# Patient Record
Sex: Female | Born: 1975 | Race: White | Hispanic: No | Marital: Single | Smoking: Former smoker
Health system: Southern US, Community
[De-identification: ages and names within clinical notes are randomized; demographics above are authoritative.]

## PROBLEM LIST (undated history)

## (undated) HISTORY — PX: ABDOMINAL HYSTERECTOMY: SHX81

## (undated) HISTORY — PX: TUBAL LIGATION: SHX77

## (undated) HISTORY — PX: CYST REMOVAL TRUNK: SHX6283

---

## 1999-04-15 ENCOUNTER — Other Ambulatory Visit: Admission: RE | Admit: 1999-04-15 | Discharge: 1999-04-15 | Payer: Self-pay | Admitting: Obstetrics & Gynecology

## 2001-08-13 ENCOUNTER — Inpatient Hospital Stay (HOSPITAL_COMMUNITY): Admission: AD | Admit: 2001-08-13 | Discharge: 2001-08-13 | Payer: Self-pay | Admitting: *Deleted

## 2018-07-24 ENCOUNTER — Encounter (HOSPITAL_COMMUNITY): Payer: Self-pay | Admitting: *Deleted

## 2018-07-24 ENCOUNTER — Ambulatory Visit (HOSPITAL_COMMUNITY)
Admission: EM | Admit: 2018-07-24 | Discharge: 2018-07-24 | Disposition: A | Discharge status: Home / Self Care | Attending: Family | Admitting: Family

## 2018-07-24 ENCOUNTER — Ambulatory Visit (INDEPENDENT_AMBULATORY_CARE_PROVIDER_SITE_OTHER)

## 2018-07-24 DIAGNOSIS — W010XXA Fall on same level from slipping, tripping and stumbling without subsequent striking against object, initial encounter: Secondary | ICD-10-CM

## 2018-07-24 DIAGNOSIS — M25571 Pain in right ankle and joints of right foot: Secondary | ICD-10-CM | POA: Diagnosis not present

## 2018-07-24 DIAGNOSIS — W19XXXA Unspecified fall, initial encounter: Secondary | ICD-10-CM

## 2018-07-24 DIAGNOSIS — Y92009 Unspecified place in unspecified non-institutional (private) residence as the place of occurrence of the external cause: Secondary | ICD-10-CM

## 2018-07-24 DIAGNOSIS — M79604 Pain in right leg: Secondary | ICD-10-CM | POA: Diagnosis not present

## 2018-07-24 DIAGNOSIS — M25551 Pain in right hip: Secondary | ICD-10-CM | POA: Diagnosis not present

## 2018-07-24 MED ORDER — DICLOFENAC SODIUM 75 MG PO TBEC: 75.0000 mg | DELAYED_RELEASE_TABLET | Freq: Two times a day (BID) | ORAL | 0 refills | Status: DC | PRN

## 2018-07-24 MED ORDER — CYCLOBENZAPRINE HCL 10 MG PO TABS: ORAL_TABLET | ORAL | 0 refills | Status: AC

## 2018-07-24 NOTE — Discharge Instructions (Addendum)
Recommend start Voltaren 75mg  twice a day as directed. May take Flexeril 10mg  1/2 to 1 tablet every 8 hours as needed for muscle pain/spasms. Apply heat to hip and leg area. Apply ice to ankle. Wear ace wrap for support. Follow-up with the Orthopedic as planned for further evaluation.
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
## 2018-07-24 NOTE — ED Provider Notes (Signed)
[facility]    CSN: [id number] Arrival date & time: 07/24/18  1223     History   Chief Complaint Chief Complaint  Patient presents with  . Fall  . Leg Pain    HPI Alexis Fuller is a 42 y.o. female.   42 year old female presents with injury to her right hip, leg and ankle from a fall about 1 week ago. She was trying to get to the bathroom when she slipped, twisted her ankle and fell on her right side. She was intoxicated from alcohol consumption so she does not remember incident. But a friend told her she did not hit her head or injure other parts of her body. She experienced immediate pain (per friend) and continues to have right ankle pain that travels up to her knee as well as pain that travels down from her right lower hip. She denies any numbness. She is unable to bear full weight on her right side. She has applied ice and taken Naproxen with minimal relief. No previous injury to right hip or ankle to her knowledge. No other chronic health issues. Takes no daily medication.   The history is provided by the patient.    History reviewed. No pertinent past medical history.  There are no active problems to display for this patient.   Past Surgical History:  Procedure Laterality Date  . CYST REMOVAL TRUNK    . TUBAL LIGATION      OB History   No obstetric history on file.      Home Medications    Prior to Admission medications   Medication Sig Start Date End Date Taking? Authorizing Provider  cyclobenzaprine (FLEXERIL) 10 MG tablet Take 1/2 to 1 whole tablet every 8 hours as needed for muscle pain/spasms. 07/24/18   Katy Apo, NP  diclofenac (VOLTAREN) 75 MG EC tablet Take 1 tablet (75 mg total) by mouth 2 (two) times daily as needed for moderate pain. 07/24/18   Katy Apo, NP    Family History Family History  Problem Relation Age of Onset  . Diabetes Father   . Seizures Son   . Autism spectrum disorder Son     Social History Social  History   Tobacco Use  . Smoking status: Current Every Day Smoker  . Smokeless tobacco: Never Used  Substance Use Topics  . Alcohol use: Yes    Comment: occasionally  . Drug use: Never     Allergies   Percocet [oxycodone-acetaminophen] and Septra [sulfamethoxazole-trimethoprim]   Review of Systems Review of Systems  Constitutional: Negative for activity change, appetite change, chills, fatigue and fever.  Eyes: Negative for photophobia and visual disturbance.  Respiratory: Negative for cough, chest tightness, shortness of breath and wheezing.   Cardiovascular: Negative for chest pain and leg swelling.  Gastrointestinal: Negative for nausea and vomiting.  Genitourinary: Negative for decreased urine volume, difficulty urinating, flank pain, hematuria and pelvic pain.  Musculoskeletal: Positive for arthralgias, gait problem and myalgias. Negative for back pain.  Skin: Negative for color change, rash and wound.  Neurological: Negative for dizziness, tremors, seizures, syncope, weakness, light-headedness, numbness and headaches.  Hematological: Negative for adenopathy. Does not bruise/bleed easily.     Physical Exam Triage Vital Signs ED Triage Vitals [07/24/18 1328]  Enc Vitals Group     BP 101/66     Pulse Rate 84     Resp 18     Temp (!) 97.4 F (36.3 C)     Temp Source  Oral     SpO2 97 %     Weight      Height      Head Circumference      Peak Flow      Pain Score 9     Pain Loc      Pain Edu?      Excl. in [place]?    No data found.  Updated Vital Signs BP 101/66   Pulse 84   Temp (!) 97.4 F (36.3 C) (Oral)   Resp 18   LMP 07/03/2018 (Exact Date)   SpO2 97%   Visual Acuity Right Eye Distance:   Left Eye Distance:   Bilateral Distance:    Right Eye Near:   Left Eye Near:    Bilateral Near:     Physical Exam Vitals signs and nursing note reviewed.  Constitutional:      General: She is not in acute distress.    Appearance: Normal appearance. She is  well-developed and overweight. She is not ill-appearing.  HENT:     Head: Normocephalic and atraumatic.     Right Ear: External ear normal.     Left Ear: External ear normal.     Nose: Nose normal.  Eyes:     Extraocular Movements: Extraocular movements intact.     Conjunctiva/sclera: Conjunctivae normal.  Neck:     Musculoskeletal: Normal range of motion.  Cardiovascular:     Rate and Rhythm: Normal rate.  Pulmonary:     Effort: Pulmonary effort is normal.  Musculoskeletal:        General: Tenderness and signs of injury present. No swelling.     Right hip: She exhibits decreased range of motion and tenderness. She exhibits no swelling, no deformity and no laceration.     Right lower leg: No edema.     Left lower leg: No edema.       Legs:     Right foot: Decreased range of motion. Normal capillary refill. Tenderness and swelling present. No deformity.       Feet:     Comments: Has decreased range of motion of right hip, particularly with flexion and full extension. Tender mainly in right lower hip and upper hamstring area. No distinct swelling or bruising present.  Has decreased range of motion or right ankle, particularly with rotation. Pain with all movements. Tender along lateral malleolus area with mild swelling. No distinct bruising present. Good distal pulses and capillary refill. No neuro deficits noted.    Skin:    General: Skin is warm and dry.     Capillary Refill: Capillary refill takes less than 2 seconds.     Findings: No bruising, erythema or rash.  Neurological:     General: No focal deficit present.     Mental Status: She is alert and oriented to person, place, and time.     Sensory: Sensation is intact.     Motor: Motor function is intact.     Gait: Gait abnormal (due to right hip and foot pain).  Psychiatric:        Attention and Perception: Attention normal.        Mood and Affect: Mood and affect normal.        Speech: Speech normal.        Behavior:  Behavior is cooperative.      UC Treatments / Results  Labs (all labs ordered are listed, but only abnormal results are displayed) Labs Reviewed - No data to display  EKG None  Radiology Dg Ankle Complete Right  Result Date: 07/24/2018 CLINICAL DATA:  Post fall 1 week ago with persistent right ankle pain. EXAM: RIGHT ANKLE - COMPLETE 3+ VIEW COMPARISON:  None. FINDINGS: No definite acute fracture or dislocation. Joint spaces are preserved. Ankle mortise is preserved. Suspected small ankle joint effusion. Tiny ossicle adjacent to the cranial aspect of the navicular bone likely represents sequela of remote avulsive injury. Small plantar calcaneal spur. Minimal enthesopathic change involving the Achilles tendon insertion site. Suspected minimal soft tissue swelling about the lateral malleolus. IMPRESSION: 1. Small amount of soft tissue swelling about the lateral malleolus with associated small ankle joint effusion but without associated acute fracture or dislocation. 2. Suspected old avulsive injury of the navicular bone. 3. Small plantar calcaneal spur. Electronically Signed   By: Sandi Mariscal M.D.   On: 07/24/2018 14:42   Dg Hip Unilat With Pelvis 2-3 Views Right  Result Date: 07/24/2018 CLINICAL DATA:  Post fall with persistent right hip pain. EXAM: DG HIP (WITH OR WITHOUT PELVIS) 2-3V RIGHT COMPARISON:  None. FINDINGS: No fracture or dislocation. Right hip joint spaces appear preserved. No definite evidence of avascular necrosis. Limited visualization of the pelvis and contralateral left hip is normal. Ingested radiopaque debris/pill fragment overlies the left lower abdomen. Regional soft tissues appear normal. IMPRESSION: No explanation for patient's persistent right hip pain. Electronically Signed   By: Sandi Mariscal M.D.   On: 07/24/2018 14:43    Procedures Procedures (including critical care time)  Medications Ordered in UC Medications - No data to display  Initial Impression /  Assessment and Plan / UC Course  I have reviewed the triage vital signs and the nursing notes.  Pertinent labs & imaging results that were available during my care of the patient were reviewed by me and considered in my medical decision making (see chart for details).    Reviewed hip x-ray results- no evidence of fracture or dislocation. Reviewed right ankle x-ray results- no fracture but soft tissue swelling with ankle effusion and heel spur. Discussed possible need for further evaluation with Orthopedic. Recommend trial Voltaren 75mg  twice a day as directed. May take Flexeril 10mg  1/2 to 1 whole tablet every 8 hours as needed for muscle pain/spasms. Apply heat to hip area for comfort and ice ankle. Wear ace wrap for support. Follow-up with [facility] as planned for further evaluation.  Final Clinical Impressions(s) / UC Diagnoses   Final diagnoses:  Fall at home, initial encounter  Acute right hip pain  Right leg pain  Acute right ankle pain     Discharge Instructions     Recommend start Voltaren 75mg  twice a day as directed. May take Flexeril 10mg  1/2 to 1 tablet every 8 hours as needed for muscle pain/spasms. Apply heat to hip and leg area. Apply ice to ankle. Wear ace wrap for support. Follow-up with the Orthopedic as planned for further evaluation.     ED Prescriptions    Medication Sig Dispense Auth. Provider   diclofenac (VOLTAREN) 75 MG EC tablet Take 1 tablet (75 mg total) by mouth 2 (two) times daily as needed for moderate pain. 20 tablet Katy Apo, NP   cyclobenzaprine (FLEXERIL) 10 MG tablet Take 1/2 to 1 whole tablet every 8 hours as needed for muscle pain/spasms. 15 tablet Katy Apo, NP     Controlled Substance Prescriptions [place] Controlled Substance Registry consulted? Yes, I have consulted the [place] Controlled Substances Registry for this patient. No  active Rx listed. No need for controlled medication for pain management at this time.      Katy Apo, NP 07/25/18 [phone]

## 2018-12-13 ENCOUNTER — Other Ambulatory Visit: Payer: Self-pay

## 2018-12-13 ENCOUNTER — Encounter (HOSPITAL_COMMUNITY): Payer: Self-pay | Admitting: Emergency Medicine

## 2018-12-13 ENCOUNTER — Emergency Department (HOSPITAL_COMMUNITY)

## 2018-12-13 ENCOUNTER — Emergency Department (HOSPITAL_COMMUNITY)
Admission: EM | Admit: 2018-12-13 | Discharge: 2018-12-13 | Disposition: A | Discharge status: Home / Self Care | Attending: Emergency Medicine | Admitting: Emergency Medicine

## 2018-12-13 DIAGNOSIS — M79603 Pain in arm, unspecified: Secondary | ICD-10-CM | POA: Diagnosis present

## 2018-12-13 DIAGNOSIS — M50021 Cervical disc disorder at C4-C5 level with myelopathy: Secondary | ICD-10-CM | POA: Diagnosis not present

## 2018-12-13 DIAGNOSIS — M5010 Cervical disc disorder with radiculopathy, unspecified cervical region: Secondary | ICD-10-CM

## 2018-12-13 DIAGNOSIS — F17200 Nicotine dependence, unspecified, uncomplicated: Secondary | ICD-10-CM | POA: Insufficient documentation

## 2018-12-13 DIAGNOSIS — R200 Anesthesia of skin: Secondary | ICD-10-CM | POA: Insufficient documentation

## 2018-12-13 DIAGNOSIS — Z79899 Other long term (current) drug therapy: Secondary | ICD-10-CM | POA: Insufficient documentation

## 2018-12-13 MED ORDER — PREDNISONE 50 MG PO TABS
50.0000 mg | ORAL_TABLET | Freq: Once | ORAL | Status: AC
  Administered 2018-12-13: 50 mg via ORAL
  Filled 2018-12-13: qty 1

## 2018-12-13 MED ORDER — IBUPROFEN 800 MG PO TABS
800.0000 mg | ORAL_TABLET | Freq: Once | ORAL | Status: AC
  Administered 2018-12-13: 800 mg via ORAL
  Filled 2018-12-13: qty 1

## 2018-12-13 MED ORDER — METHYLPREDNISOLONE 4 MG PO TBPK: ORAL_TABLET | ORAL | 0 refills | Status: DC

## 2018-12-13 NOTE — ED Notes (Signed)
Patient transported to MRI by this RN  

## 2018-12-13 NOTE — ED Notes (Signed)
Pt ambulatory to bathroom without difficulty.  

## 2018-12-13 NOTE — ED Triage Notes (Signed)
Pt reports that she was moving into a new place and yesterday when she was getting on to the bed she did something to her neck and then started having bilat arm tingling and numbness. Reports went to [facility] yesterday but sat there for 4 hours without being seen and decided to leave. Adds that her hands are swollen.

## 2018-12-13 NOTE — ED Notes (Signed)
Pt d/c home per MD order. Discharge summary reviewed, pt verbalizes understanding. Ambulatory off unit.  

## 2018-12-13 NOTE — ED Notes (Signed)
Bed: WA09 Expected date:  Expected time:  Means of arrival:  Comments: 25
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
## 2019-06-12 ENCOUNTER — Other Ambulatory Visit: Payer: Self-pay | Admitting: Radiology

## 2019-06-12 HISTORY — PX: BREAST BIOPSY: SHX20

## 2021-01-22 ENCOUNTER — Emergency Department (HOSPITAL_COMMUNITY): Payer: No Typology Code available for payment source

## 2021-01-22 ENCOUNTER — Other Ambulatory Visit: Payer: Self-pay

## 2021-01-22 ENCOUNTER — Encounter (HOSPITAL_COMMUNITY): Payer: Self-pay | Admitting: Emergency Medicine

## 2021-01-22 ENCOUNTER — Emergency Department (HOSPITAL_COMMUNITY)
Admission: EM | Admit: 2021-01-22 | Discharge: 2021-01-22 | Disposition: A | Discharge status: Home / Self Care | Payer: No Typology Code available for payment source | Attending: Emergency Medicine | Admitting: Emergency Medicine

## 2021-01-22 DIAGNOSIS — F17200 Nicotine dependence, unspecified, uncomplicated: Secondary | ICD-10-CM | POA: Diagnosis not present

## 2021-01-22 DIAGNOSIS — M542 Cervicalgia: Secondary | ICD-10-CM | POA: Insufficient documentation

## 2021-01-22 DIAGNOSIS — R519 Headache, unspecified: Secondary | ICD-10-CM

## 2021-01-22 DIAGNOSIS — Y92410 Unspecified street and highway as the place of occurrence of the external cause: Secondary | ICD-10-CM | POA: Insufficient documentation

## 2021-01-22 DIAGNOSIS — S161XXA Strain of muscle, fascia and tendon at neck level, initial encounter: Secondary | ICD-10-CM

## 2021-01-22 MED ORDER — ACETAMINOPHEN 500 MG PO TABS
1000.0000 mg | ORAL_TABLET | Freq: Once | ORAL | Status: AC
  Administered 2021-01-22: 1000 mg via ORAL
  Filled 2021-01-22: qty 2

## 2021-01-22 MED ORDER — METHOCARBAMOL 750 MG PO TABS: 750.0000 mg | ORAL_TABLET | Freq: Three times a day (TID) | ORAL | 0 refills | Status: AC | PRN

## 2021-01-22 MED ORDER — METHOCARBAMOL 750 MG PO TABS: 750.0000 mg | ORAL_TABLET | Freq: Three times a day (TID) | ORAL | 0 refills | Status: DC | PRN

## 2021-01-22 NOTE — Discharge Instructions (Addendum)
It was our pleasure to provide your ER care today - we hope that you feel better.  Your cts look good - no acute traumatic injury is noted.   Take ibuprofen or acetaminophen as need for pain.  You may also take robaxin as need for muscle pain/spasm - no driving when taking.  Follow up with primary care doctor in 1-2 weeks if symptoms fail to improve/resolve.  Return to ER if worse, new symptoms, new/severe pain, numbness/weakness, or other concern.
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
## 2021-03-14 ENCOUNTER — Other Ambulatory Visit: Payer: Self-pay | Admitting: *Deleted

## 2021-03-14 ENCOUNTER — Telehealth: Payer: Self-pay | Admitting: *Deleted

## 2021-03-14 DIAGNOSIS — Z1231 Encounter for screening mammogram for malignant neoplasm of breast: Secondary | ICD-10-CM

## 2021-03-14 NOTE — Telephone Encounter (Signed)
Attempted to call patient to schedule appointment for screening mammogram. No one answered the phone. Left voicemail for patient to cal me back.

## 2021-04-07 ENCOUNTER — Other Ambulatory Visit: Payer: Self-pay | Admitting: Student

## 2021-04-07 ENCOUNTER — Other Ambulatory Visit: Payer: Self-pay

## 2021-04-07 ENCOUNTER — Ambulatory Visit
Admission: RE | Admit: 2021-04-07 | Discharge: 2021-04-07 | Disposition: A | Discharge status: Home / Self Care | Payer: No Typology Code available for payment source | Source: Ambulatory Visit | Attending: Student | Admitting: Student

## 2021-04-07 DIAGNOSIS — Z1231 Encounter for screening mammogram for malignant neoplasm of breast: Secondary | ICD-10-CM

## 2022-11-07 ENCOUNTER — Ambulatory Visit
Admission: EM | Admit: 2022-11-07 | Discharge: 2022-11-07 | Disposition: A | Discharge status: Home / Self Care | Payer: Medicaid Other | Attending: Physician Assistant | Admitting: Physician Assistant

## 2022-11-07 ENCOUNTER — Encounter: Payer: Self-pay | Admitting: Emergency Medicine

## 2022-11-07 DIAGNOSIS — N760 Acute vaginitis: Secondary | ICD-10-CM | POA: Diagnosis present

## 2022-11-07 DIAGNOSIS — Z113 Encounter for screening for infections with a predominantly sexual mode of transmission: Secondary | ICD-10-CM | POA: Insufficient documentation

## 2022-11-07 DIAGNOSIS — R300 Dysuria: Secondary | ICD-10-CM | POA: Insufficient documentation

## 2022-11-07 LAB — URINALYSIS, W/ REFLEX TO CULTURE (INFECTION SUSPECTED)
Bilirubin Urine: NEGATIVE
Glucose, UA: NEGATIVE mg/dL
Hgb urine dipstick: NEGATIVE
Ketones, ur: NEGATIVE mg/dL
Leukocytes,Ua: NEGATIVE
Nitrite: NEGATIVE
Protein, ur: NEGATIVE mg/dL
Specific Gravity, Urine: 1.01 (ref 1.005–1.030)
WBC, UA: NONE SEEN WBC/hpf (ref 0–5)
pH: 6 (ref 5.0–8.0)

## 2022-11-07 LAB — WET PREP, GENITAL
Clue Cells Wet Prep HPF POC: NONE SEEN
Sperm: NONE SEEN
Trich, Wet Prep: NONE SEEN
WBC, Wet Prep HPF POC: NONE SEEN — AB (ref <10)
Yeast Wet Prep HPF POC: NONE SEEN

## 2022-11-07 MED ORDER — METRONIDAZOLE 500 MG PO TABS: 500.0000 mg | ORAL_TABLET | Freq: Two times a day (BID) | ORAL | 0 refills | Status: AC

## 2022-11-07 NOTE — ED Provider Notes (Signed)
[facility]    CSN: [id number] Arrival date & time: 11/07/22  1134      History   Chief Complaint Chief Complaint  Patient presents with   Vaginal Discharge   Dysuria    HPI Alexis Fuller is a 47 y.o. female presenting for approximately 1 week history of vaginal odor.  She also reports that it "feels wet down there."  She denies any noticeable discharge.  States the moist sensation and odor are worse after intercourse.  She reports she has 1 sexual partner that she sees every other week.  She says they have been together for about a year and she does not really have any significant concerns of STIs.  Partner has no symptoms.  Patient describes the odor as "fishy."  She says she has had this before and received antibiotics and got better.  She also reports a little burning sensation when she urinates but no frequency, urgency, hematuria.  No abdominal or pelvic pain.  Has been using boric acid suppositories, vaginal probiotics and some relief.  HPI  History reviewed. No pertinent past medical history.  There are no problems to display for this patient.   Past Surgical History:  Procedure Laterality Date   ABDOMINAL HYSTERECTOMY     TUBAL LIGATION      OB History   No obstetric history on file.      Home Medications    Prior to Admission medications   Medication Sig Start Date End Date Taking? Authorizing Provider  metroNIDAZOLE (FLAGYL) 500 MG tablet Take 1 tablet (500 mg total) by mouth 2 (two) times daily for 7 days. 11/07/22 11/14/22 Yes Danton Clap, PA-C    Family History History reviewed. No pertinent family history.  Social History Social History   Tobacco Use   Smoking status: Former    Types: Cigarettes   Smokeless tobacco: Never  Vaping Use   Vaping Use: Never used  Substance Use Topics   Alcohol use: Yes   Drug use: Never     Allergies   Sulfamethoxazole-trimethoprim   Review of Systems Review of Systems  Constitutional:   Negative for fatigue and fever.  Gastrointestinal:  Negative for abdominal pain, nausea and vomiting.  Genitourinary:  Positive for dysuria. Negative for flank pain, frequency, hematuria, urgency, vaginal bleeding, vaginal discharge and vaginal pain.       +vaginal odor  Musculoskeletal:  Negative for back pain.  Skin:  Negative for rash.     Physical Exam Triage Vital Signs ED Triage Vitals  Enc Vitals Group     BP      Pulse      Resp      Temp      Temp src      SpO2      Weight      Height      Head Circumference      Peak Flow      Pain Score      Pain Loc      Pain Edu?      Excl. in [place]?    No data found.  Updated Vital Signs BP 119/76 (BP Location: Left Arm)   Pulse 67   Temp 98.1 F (36.7 C) (Oral)   Resp 14   Ht 5\' 1"  (1.549 m)   Wt 200 lb (90.7 kg)   SpO2 100%   BMI 37.79 kg/m   Physical Exam Vitals and nursing note reviewed.  Constitutional:      General:  She is not in acute distress.    Appearance: Normal appearance. She is not ill-appearing or toxic-appearing.  HENT:     Head: Normocephalic and atraumatic.  Eyes:     General: No scleral icterus.       Right eye: No discharge.        Left eye: No discharge.     Conjunctiva/sclera: Conjunctivae normal.  Cardiovascular:     Rate and Rhythm: Normal rate and regular rhythm.  Pulmonary:     Effort: Pulmonary effort is normal. No respiratory distress.  Musculoskeletal:     Cervical back: Neck supple.  Skin:    General: Skin is dry.  Neurological:     General: No focal deficit present.     Mental Status: She is alert. Mental status is at baseline.     Motor: No weakness.     Gait: Gait normal.  Psychiatric:        Mood and Affect: Mood normal.        Behavior: Behavior normal.        Thought Content: Thought content normal.      UC Treatments / Results  Labs (all labs ordered are listed, but only abnormal results are displayed) Labs Reviewed  WET PREP, GENITAL - Abnormal; Notable for  the following components:      Result Value   WBC, Wet Prep HPF POC NONE SEEN (*)    All other components within normal limits  URINALYSIS, W/ REFLEX TO CULTURE (INFECTION SUSPECTED) - Abnormal; Notable for the following components:   Bacteria, UA FEW (*)    All other components within normal limits  CERVICOVAGINAL ANCILLARY ONLY    EKG   Radiology No results found.  Procedures Procedures (including critical care time)  Medications Ordered in UC Medications - No data to display  Initial Impression / Assessment and Plan / UC Course  I have reviewed the triage vital signs and the nursing notes.  Pertinent labs & imaging results that were available during my care of the patient were reviewed by me and considered in my medical decision making (see chart for details).   47 y/o female reporting for "fishy " smelling vaginal odor x 1 week.  1 sexual partner.  No significant concern for STIs.  Mild dysuria.  Patient elects to forego pelvic exam and perform vaginal self swab.  Urinalysis also obtained.  UA not consistent with UTI.  Vaginal swab is negative for clue cells, trichomoniasis, yeast.  Pending GC/chlamydia testing.  Given patient's history of BV and foul-smelling vaginal discharge, will cover her for the possibility of bacterial vaginosis with metronidazole.  Will treat additionally if positive for gonorrhea or chlamydia.  Supportive care.  Follow-up as needed.   Final Clinical Impressions(s) / UC Diagnoses   Final diagnoses:  Acute vaginitis  Screening examination for sexually transmitted disease  Dysuria     Discharge Instructions      The most common types of vaginal infections are yeast infections and bacterial vaginosis. Neither of which are really considered to be sexually transmitted. Often a pH swab or wet prep is performed and if abnormal may reveal either type of infection. Begin metronidazole if prescribed for possible BV infection. If there is concern for  yeast infection, fluconazole is often prescribed . Take this as directed. You may also apply topical miconazole (can be purchased OTC) externally for relief of itching. Increase rest and fluid intake. If labs sent out, we will call within 2-5 days with results and  amend treatment if necessary. Always try to use pH balanced washes/wipes, urinate after intercourse, stay hydrated, and take probiotics if you are prone to vaginal infections. Return or see PCP or gynecologist for new/worsening infections.       ED Prescriptions     Medication Sig Dispense Auth. Provider   metroNIDAZOLE (FLAGYL) 500 MG tablet Take 1 tablet (500 mg total) by mouth 2 (two) times daily for 7 days. 14 tablet Gretta Cool      PDMP not reviewed this encounter.   Danton Clap, PA-C 11/07/22 1244

## 2022-11-07 NOTE — ED Triage Notes (Signed)
Patient c/o vaginal discharge and odor for a week.  Patient states that she used OTC suppositories and probiotic and is now having some burning or discomfort.

## 2022-11-07 NOTE — Discharge Instructions (Signed)
The most common types of vaginal infections are yeast infections and bacterial vaginosis. Neither of which are really considered to be sexually transmitted. Often a pH swab or wet prep is performed and if abnormal may reveal either type of infection. Begin metronidazole if prescribed for possible BV infection. If there is concern for yeast infection, fluconazole is often prescribed . Take this as directed. You may also apply topical miconazole (can be purchased OTC) externally for relief of itching. Increase rest and fluid intake. If labs sent out, we will call within 2-5 days with results and amend treatment if necessary. Always try to use pH balanced washes/wipes, urinate after intercourse, stay hydrated, and take probiotics if you are prone to vaginal infections. Return or see PCP or gynecologist for new/worsening infections.   

## 2022-11-08 ENCOUNTER — Ambulatory Visit: Payer: Self-pay

## 2022-11-09 ENCOUNTER — Encounter: Payer: Self-pay | Admitting: Emergency Medicine

## 2022-11-09 LAB — CERVICOVAGINAL ANCILLARY ONLY
Chlamydia: NEGATIVE
Comment: NEGATIVE
Comment: NORMAL
Neisseria Gonorrhea: NEGATIVE

## 2023-03-30 IMAGING — MG MM DIGITAL SCREENING BILAT W/ TOMO AND CAD
8 series · 8 of 24 positions shown · non-contrast
Comparison: Previous exam(s).

CLINICAL DATA: Screening.

EXAM:
DIGITAL SCREENING BILATERAL MAMMOGRAM WITH TOMOSYNTHESIS AND CAD
TECHNIQUE: Bilateral screening digital craniocaudal and mediolateral oblique
mammograms were obtained. Bilateral screening digital breast
tomosynthesis was performed. The images were evaluated with
computer-aided detection.

[R CC synth-2D]
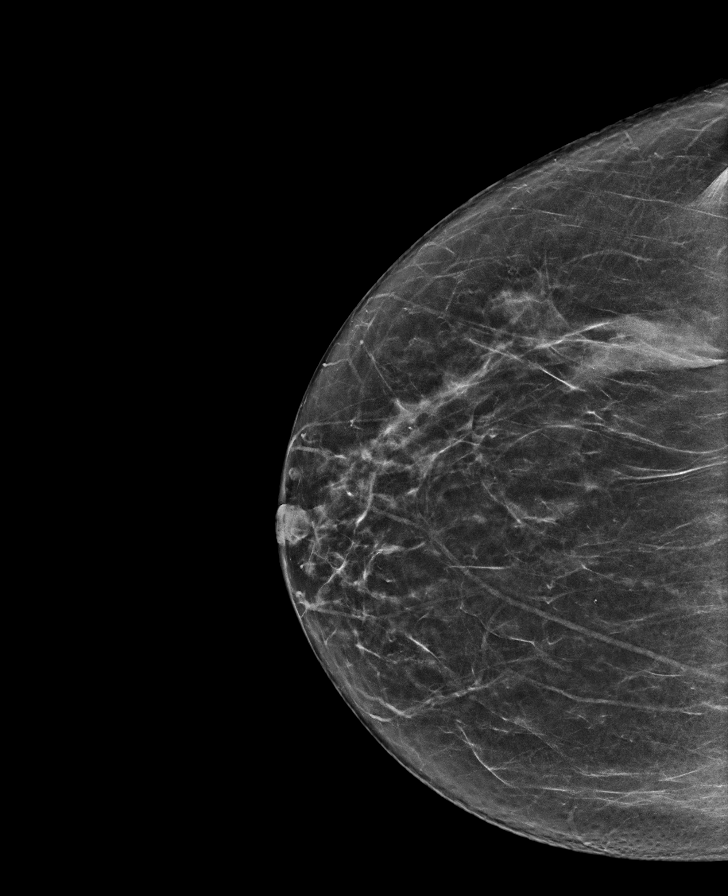

[L MLO synth-2D]
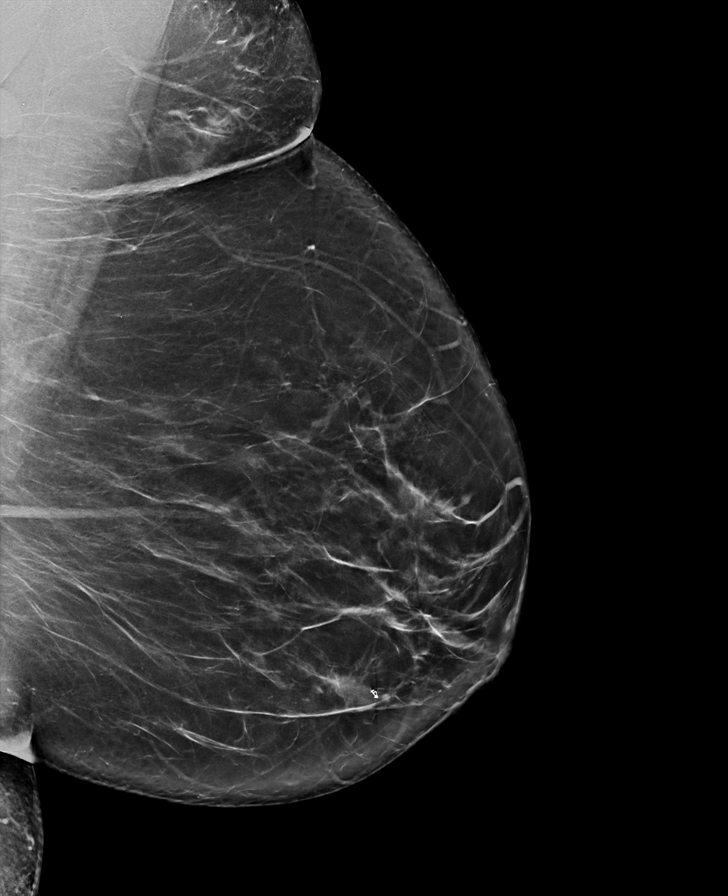

[L CC synth-2D]
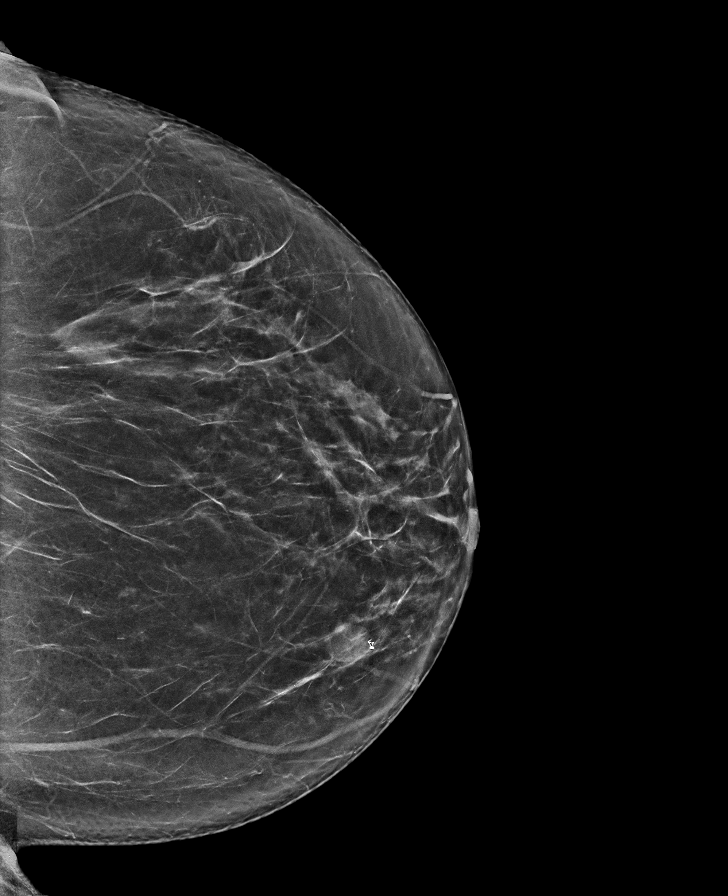

[R MLO synth-2D]
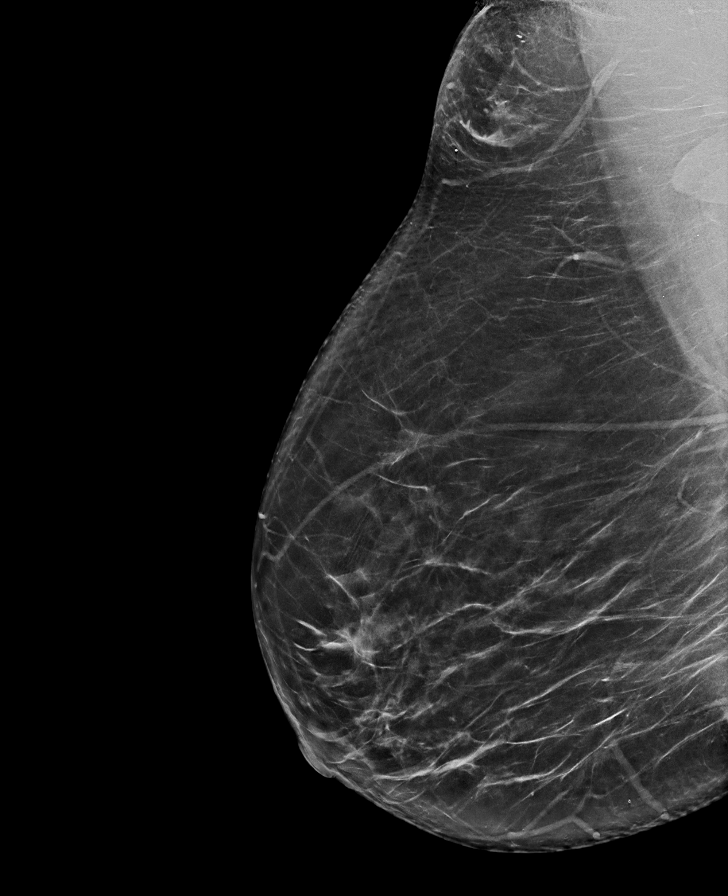

[L MLO tomo · tomo slice 43/85.0]
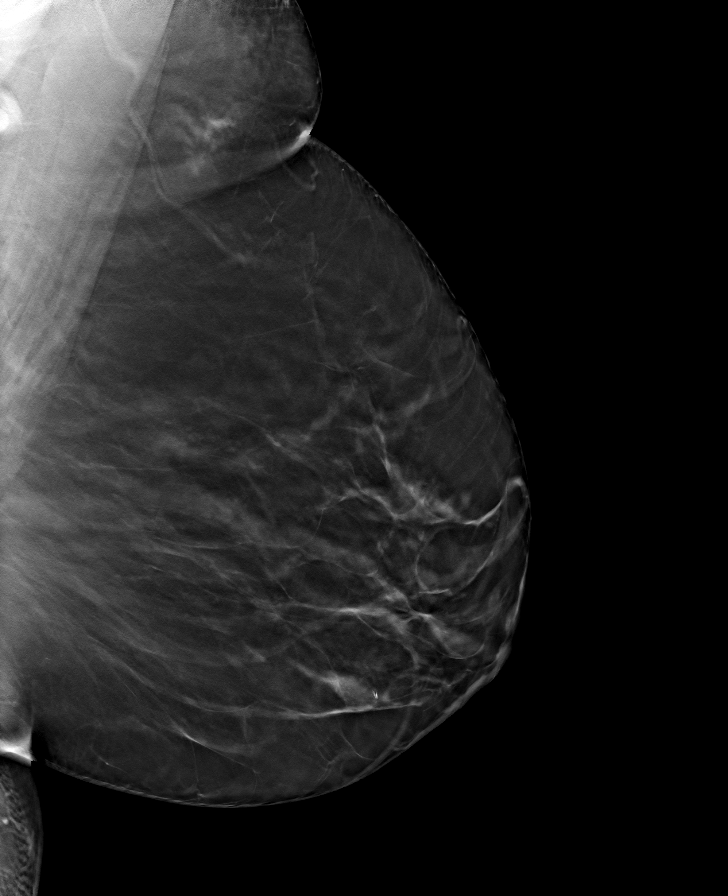

[R CC tomo · tomo slice 35/68.0]
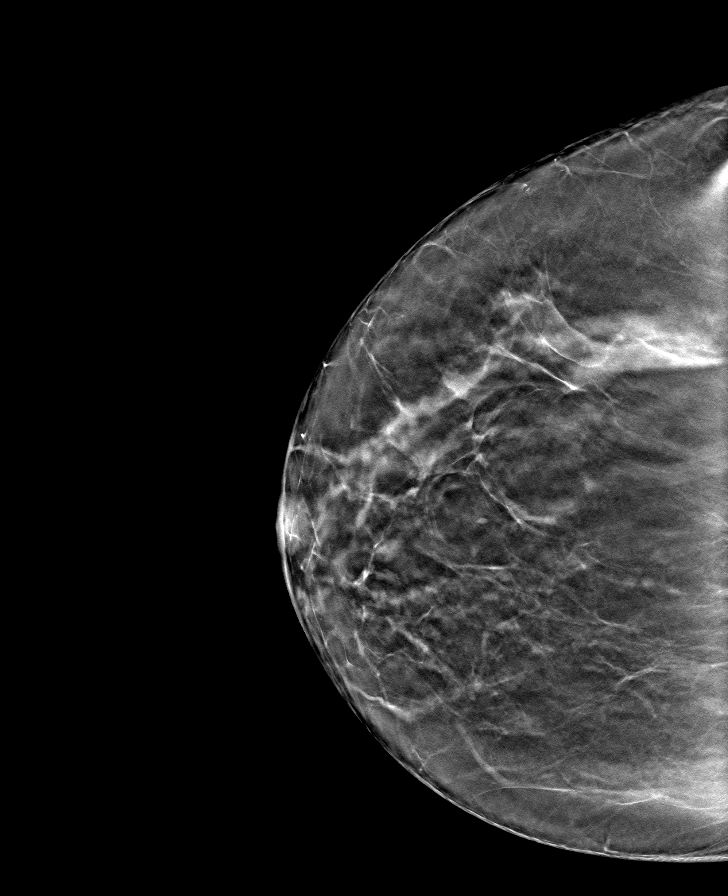

[R MLO tomo · tomo slice 43/85.0]
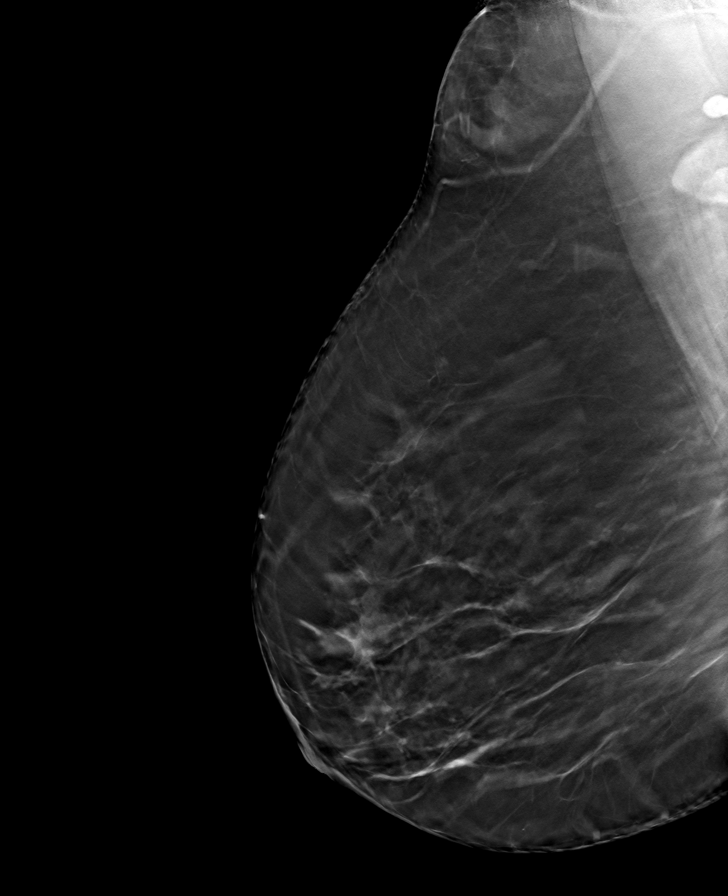

[L CC tomo · tomo slice 37/74.0]
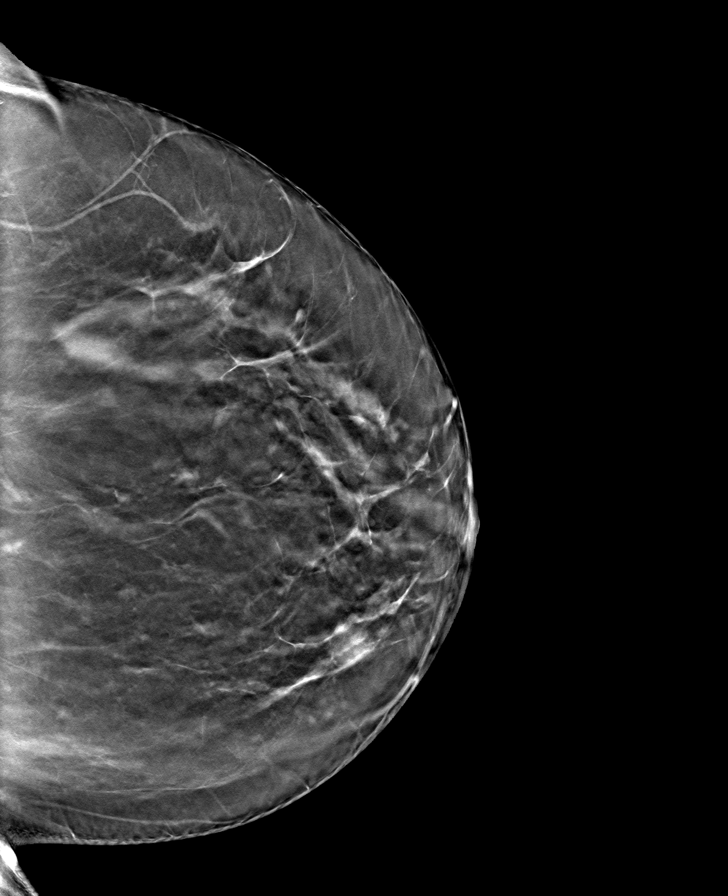

[8 of 24 positions shown; findings below may reference images not displayed]

ACR Breast Density Category b: There are scattered areas of
fibroglandular density.
FINDINGS: There are no findings suspicious for malignancy.
IMPRESSION: No mammographic evidence of malignancy. A result letter of this
screening mammogram will be mailed directly to the patient.

RECOMMENDATION:
Screening mammogram in one year. (Code:51-O-LD2)

BI-RADS CATEGORY  1: Negative.

## 2023-06-12 ENCOUNTER — Ambulatory Visit
Admission: RE | Admit: 2023-06-12 | Discharge: 2023-06-12 | Disposition: A | Discharge status: Home / Self Care | Payer: Medicaid Other | Source: Ambulatory Visit

## 2023-06-12 VITALS — BP 118/73 | HR 57 | Temp 98.6°F | Resp 14 | Ht 61.0 in | Wt 200.0 lb

## 2023-06-12 DIAGNOSIS — L02211 Cutaneous abscess of abdominal wall: Secondary | ICD-10-CM

## 2023-06-12 MED ORDER — DOXYCYCLINE HYCLATE 100 MG PO CAPS: 100.0000 mg | ORAL_CAPSULE | Freq: Two times a day (BID) | ORAL | 0 refills | Status: AC

## 2023-06-12 NOTE — ED Triage Notes (Signed)
Patient reports abscess on her right lower abdomen for the past 2 days.  Patient reports a little drainage.  Patient denies fevers.

## 2023-06-12 NOTE — ED Provider Notes (Signed)
[facility]    CSN: [id number] Arrival date & time: 06/12/23  1156      History   Chief Complaint Chief Complaint  Alexis Fuller presents with   Abscess    Appointment    HPI Alexis Fuller is a 47 y.o. female.   HPI  47 year old female with no significant past medical history presents for evaluation of an abscess on her pannus that has been there for the last 2 days.  She reports that the area has been covered with a Band-Aid but she did notice that it started to drain some today.  She denies fevers.  History reviewed. No pertinent past medical history.  There are no problems to display for this Alexis Fuller.   Past Surgical History:  Procedure Laterality Date   ABDOMINAL HYSTERECTOMY     BREAST BIOPSY Left 06/12/2019   CYST REMOVAL TRUNK     TUBAL LIGATION      OB History   No obstetric history on file.      Home Medications    Prior to Admission medications   Medication Sig Start Date End Date Taking? Authorizing Provider  citalopram (CELEXA) 20 MG tablet Take 1 tablet by mouth once daily for anxiety 12/17/22  Yes [provider]  doxycycline (VIBRAMYCIN) 100 MG capsule Take 1 capsule (100 mg total) by mouth 2 (two) times daily for 7 days. 06/12/23 06/19/23 Yes Becky Augusta, NP  gabapentin (NEURONTIN) 300 MG capsule  04/07/19  Yes [provider]  hydrOXYzine (VISTARIL) 25 MG capsule Take 25 mg by mouth. 12/17/22  Yes [provider]  traZODone (DESYREL) 50 MG tablet Take by mouth. 02/16/23  Yes [provider]  cyclobenzaprine (FLEXERIL) 10 MG tablet Take 1/2 to 1 whole tablet every 8 hours as needed for muscle pain/spasms. Alexis Fuller not taking: Reported on 12/13/2018 07/24/18   Sudie Grumbling, NP  ibuprofen (ADVIL) 200 MG tablet Take 400 mg by mouth every 6 (six) hours as needed for fever or headache.    [provider]  methocarbamol (ROBAXIN) 750 MG tablet Take 1 tablet (750 mg total) by mouth 3 (three) times daily as  needed (muscle spasm/pain). 01/22/21   Cathren Laine, MD    Family History Family History  Problem Relation Age of Onset   Diabetes Father    Seizures Son    Autism spectrum disorder Son    Breast cancer Neg Hx     Social History Social History   Tobacco Use   Smoking status: Former    Types: Cigarettes   Smokeless tobacco: Never  Vaping Use   Vaping status: Never Used  Substance Use Topics   Alcohol use: Yes    Comment: occasionally   Drug use: Never     Allergies   Percocet [oxycodone-acetaminophen], Septra [sulfamethoxazole-trimethoprim], and Sulfamethoxazole-trimethoprim   Review of Systems Review of Systems  Constitutional:  Positive for fever.  Skin:  Positive for color change and wound.     Physical Exam Triage Vital Signs ED Triage Vitals [06/12/23 1226]  Encounter Vitals Group     BP      Systolic BP Percentile      Diastolic BP Percentile      Pulse      Resp      Temp      Temp src      SpO2      Weight 200 lb (90.7 kg)     Height 5\' 1"  (1.549 m)     Head Circumference  Peak Flow      Pain Score 6     Pain Loc      Pain Education      Exclude from Growth Chart    No data found.  Updated Vital Signs BP 118/73 (BP Location: Right Arm)   Pulse (!) 57   Temp 98.6 F (37 C) (Oral)   Resp 14   Ht 5\' 1"  (1.549 m)   Wt 200 lb (90.7 kg)   LMP 04/07/2021   SpO2 98%   BMI 37.79 kg/m   Visual Acuity Right Eye Distance:   Left Eye Distance:   Bilateral Distance:    Right Eye Near:   Left Eye Near:    Bilateral Near:     Physical Exam Vitals and nursing note reviewed.  Constitutional:      Appearance: Normal appearance. She is not ill-appearing.  HENT:     Head: Normocephalic and atraumatic.  Skin:    General: Skin is warm and dry.     Capillary Refill: Capillary refill takes less than 2 seconds.     Findings: Erythema and lesion present.  Neurological:     General: No focal deficit present.     Mental Status: She is  alert and oriented to person, place, and time.      UC Treatments / Results  Labs (all labs ordered are listed, but only abnormal results are displayed) Labs Reviewed - No data to display  EKG   Radiology No results found.  Procedures Procedures (including critical care time)  Medications Ordered in UC Medications - No data to display  Initial Impression / Assessment and Plan / UC Course  I have reviewed the triage vital signs and the nursing notes.  Pertinent labs & imaging results that were available during my care of the Alexis Fuller were reviewed by me and considered in my medical decision making (see chart for details).   Alexis Fuller is a very pleasant, nontoxic-appearing 47 year old female presenting for evaluation of pannus abscess on the right side this been present for last 2 days as outlined HPI above.  As you can see in image above there is an area of fluctuance on the underside of the Alexis Fuller's pannus on the right-hand side.  There is a dark area of erythema towards the inferior aspect which appears to be a scabbed over ruptured tract.  She does have a scant amount of drainage on her Band-Aid.  I offered the Alexis Fuller the option of performing warm compress application at home to see if the rupture on its own versus I&D here in clinic.  She reports that she has a fear of needles so she would prefer to try and do the warm compresses at home.  I will discharge her home with a diagnosis of skin abscess on doxycycline 100 mg twice daily for 7 days.  Return precautions have been reviewed.  Final Clinical Impressions(s) / UC Diagnoses   Final diagnoses:  Abdominal wall abscess     Discharge Instructions      Apply warm compresses to the abscess on your abdomen to see if he can get it to come to ahead and rupture on its own.  Take the doxycycline 100 mg twice daily with food for 7 days for treatment of your abscess.  You may take over-the-counter Tylenol and or ibuprofen  according to the package instructions as needed for any fever or pain.  If the area in question becomes larger, more red, or more painful please return for  reevaluation and we can discuss the possibility of performing an incision and drainage as we discussed.     ED Prescriptions     Medication Sig Dispense Auth. Provider   doxycycline (VIBRAMYCIN) 100 MG capsule Take 1 capsule (100 mg total) by mouth 2 (two) times daily for 7 days. 14 capsule Becky Augusta, NP      PDMP not reviewed this encounter.   Becky Augusta, NP 06/12/23 1254

## 2023-06-12 NOTE — Discharge Instructions (Addendum)
Apply warm compresses to the abscess on your abdomen to see if he can get it to come to ahead and rupture on its own.  Take the doxycycline 100 mg twice daily with food for 7 days for treatment of your abscess.  You may take over-the-counter Tylenol and or ibuprofen according to the package instructions as needed for any fever or pain.  If the area in question becomes larger, more red, or more painful please return for reevaluation and we can discuss the possibility of performing an incision and drainage as we discussed.

## 2024-05-04 ENCOUNTER — Other Ambulatory Visit: Payer: Self-pay | Admitting: Medical Genetics

## 2024-05-05 ENCOUNTER — Other Ambulatory Visit
Admission: RE | Admit: 2024-05-05 | Discharge: 2024-05-05 | Disposition: A | Discharge status: Home / Self Care | Payer: Self-pay | Source: Ambulatory Visit | Attending: Medical Genetics | Admitting: Medical Genetics

## 2024-05-16 LAB — GENECONNECT MOLECULAR SCREEN: Genetic Analysis Overall Interpretation: NEGATIVE
